# Patient Record
Sex: Male | Born: 2000 | Race: White | Hispanic: No | Marital: Single | Smoking: Never smoker
Health system: Southern US, Community
[De-identification: ages and names within clinical notes are randomized; demographics above are authoritative.]

---
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
## 2020-09-05 ENCOUNTER — Encounter: Payer: Self-pay | Admitting: Family Medicine

## 2020-11-25 ENCOUNTER — Ambulatory Visit (INDEPENDENT_AMBULATORY_CARE_PROVIDER_SITE_OTHER): Payer: Managed Care, Other (non HMO) | Admitting: Family Medicine

## 2020-11-25 ENCOUNTER — Other Ambulatory Visit: Payer: Self-pay

## 2020-11-25 ENCOUNTER — Encounter: Payer: Self-pay | Admitting: Family Medicine

## 2020-11-25 VITALS — BP 110/66 | HR 94 | Temp 97.9°F | Ht 71.0 in | Wt 181.4 lb

## 2020-11-25 DIAGNOSIS — Z0000 Encounter for general adult medical examination without abnormal findings: Secondary | ICD-10-CM

## 2020-11-25 NOTE — Patient Instructions (Signed)
It was very nice to see you today!  Keep up the good work!  No changes today.  I will see you back in a year for your next annual checkup.  Please come back to see me sooner if needed.  Take care, Dr Jimmey Ralph  Please try these tips to maintain a healthy lifestyle:   Eat at least 3 REAL meals and 1-2 snacks per day.  Aim for no more than 5 hours between eating.  If you eat breakfast, please do so within one hour of getting up.    Each meal should contain half fruits/vegetables, one quarter protein, and one quarter carbs (no bigger than a computer mouse)   Cut down on sweet beverages. This includes juice, soda, and sweet tea.     Drink at least 1 glass of water with each meal and aim for at least 8 glasses per day   Exercise at least 150 minutes every week.    Preventive Care 38-58 Years Old, Male Preventive care refers to lifestyle choices and visits with your health care provider that can promote health and wellness. This includes:  A yearly physical exam. This is also called an annual well check.  Regular dental and eye exams.  Immunizations.  Screening for certain conditions.  Healthy lifestyle choices, such as eating a healthy diet, getting regular exercise, not using drugs or products that contain nicotine and tobacco, and limiting alcohol use. What can I expect for my preventive care visit? Physical exam Your health care provider will check:  Height and weight. These may be used to calculate body mass index (BMI), which is a measurement that tells if you are at a healthy weight.  Heart rate and blood pressure.  Your skin for abnormal spots. Counseling Your health care provider may ask you questions about:  Alcohol, tobacco, and drug use.  Emotional well-being.  Home and relationship well-being.  Sexual activity.  Eating habits.  Work and work Astronomer. What immunizations do I need?  Influenza (flu) vaccine  This is recommended every  year. Tetanus, diphtheria, and pertussis (Tdap) vaccine  You may need a Td booster every 10 years. Varicella (chickenpox) vaccine  You may need this vaccine if you have not already been vaccinated. Human papillomavirus (HPV) vaccine  If recommended by your health care provider, you may need three doses over 6 months. Measles, mumps, and rubella (MMR) vaccine  You may need at least one dose of MMR. You may also need a second dose. Meningococcal conjugate (MenACWY) vaccine  One dose is recommended if you are 52-64 years old and a Orthoptist living in a residence hall, or if you have one of several medical conditions. You may also need additional booster doses. Pneumococcal conjugate (PCV13) vaccine  You may need this if you have certain conditions and were not previously vaccinated. Pneumococcal polysaccharide (PPSV23) vaccine  You may need one or two doses if you smoke cigarettes or if you have certain conditions. Hepatitis A vaccine  You may need this if you have certain conditions or if you travel or work in places where you may be exposed to hepatitis A. Hepatitis B vaccine  You may need this if you have certain conditions or if you travel or work in places where you may be exposed to hepatitis B. Haemophilus influenzae type b (Hib) vaccine  You may need this if you have certain risk factors. You may receive vaccines as individual doses or as more than one vaccine together in one  shot (combination vaccines). Talk with your health care provider about the risks and benefits of combination vaccines. What tests do I need? Blood tests  Lipid and cholesterol levels. These may be checked every 5 years starting at age 30.  Hepatitis C test.  Hepatitis B test. Screening   Diabetes screening. This is done by checking your blood sugar (glucose) after you have not eaten for a while (fasting).  Sexually transmitted disease (STD) testing. Talk with your health care  provider about your test results, treatment options, and if necessary, the need for more tests. Follow these instructions at home: Eating and drinking   Eat a diet that includes fresh fruits and vegetables, whole grains, lean protein, and low-fat dairy products.  Take vitamin and mineral supplements as recommended by your health care provider.  Do not drink alcohol if your health care provider tells you not to drink.  If you drink alcohol: ? Limit how much you have to 0-2 drinks a day. ? Be aware of how much alcohol is in your drink. In the [place] one drink equals one 12 oz bottle of beer (355 mL), one 5 oz glass of wine (148 mL), or one 1 oz glass of hard liquor (44 mL). Lifestyle  Take daily care of your teeth and gums.  Stay active. Exercise for at least 30 minutes on 5 or more days each week.  Do not use any products that contain nicotine or tobacco, such as cigarettes, e-cigarettes, and chewing tobacco. If you need help quitting, ask your health care provider.  If you are sexually active, practice safe sex. Use a condom or other form of protection to prevent STIs (sexually transmitted infections). What's next?  Go to your health care provider once a year for a well check visit.  Ask your health care provider how often you should have your eyes and teeth checked.  Stay up to date on all vaccines. This information is not intended to replace advice given to you by your health care provider. Make sure you discuss any questions you have with your health care provider. Document Revised: 11/17/2018 Document Reviewed: 11/17/2018 Elsevier Patient Education  2020 [employer].

## 2020-11-25 NOTE — Progress Notes (Signed)
Chief Complaint:  Lawrence Nelson is a 19 y.o. male who presents today for his annual comprehensive physical exam.    Assessment/Plan:  Healthy 19 year old male doing well.   Preventative Healthcare: UTD on vaccines and screenings.   Patient Counseling(The following topics were reviewed and/or handout was given):  -Nutrition: Stressed importance of moderation in sodium/caffeine intake, saturated fat and cholesterol, caloric balance, sufficient intake of fresh fruits, vegetables, and fiber.  -Stressed the importance of regular exercise.   -Substance Abuse: Discussed cessation/primary prevention of tobacco, alcohol, or other drug use; driving or other dangerous activities under the influence; availability of treatment for abuse.   -Injury prevention: Discussed safety belts, safety helmets, smoke detector, smoking near bedding or upholstery.   -Sexuality: Discussed sexually transmitted diseases, partner selection, use of condoms, avoidance of unintended pregnancy and contraceptive alternatives.   -Dental health: Discussed importance of regular tooth brushing, flossing, and dental visits.  -Health maintenance and immunizations reviewed. Please refer to Health maintenance section.  Return to care in 1 year for next preventative visit.     Subjective:  HPI:  He has no acute complaints today.   Lifestyle Diet: Balanced. Plenty of fruits and vegetables.  Exercise: Goes to the gym regularly. Plays baseball for college.   Depression screen PHQ 2/9 11/25/2020  Decreased Interest 0  Down, Depressed, Hopeless 0  PHQ - 2 Score 0   Health Maintenance Due  Topic Date Due  . Hepatitis C Screening  Never done    ROS: Per HPI, otherwise a complete review of systems was negative.   PMH:  The following were reviewed and entered/updated in epic: History reviewed. No pertinent past medical history. There are no problems to display for this patient.  Past Surgical History:  Procedure  Laterality Date  . LAPAROSCOPIC APPENDECTOMY N/A 08/13/2017   Procedure: APPENDECTOMY LAPAROSCOPIC;  Surgeon: Leonia Corona, MD;  Location: [facility] OR;  Service: General;  Laterality: N/A;    Family History  Problem Relation Age of Onset  . Hyperlipidemia Mother   . Hyperlipidemia Father   . Hypertension Father   . Hyperlipidemia Maternal Grandmother   . Hyperlipidemia Paternal Grandfather   . Hypertension Paternal Grandfather     Medications- reviewed and updated No current outpatient medications on file.   No current facility-administered medications for this visit.    Allergies-reviewed and updated No Known Allergies  Social History   Socioeconomic History  . Marital status: Single    Spouse name: Not on file  . Number of children: Not on file  . Years of education: Not on file  . Highest education level: Not on file  Occupational History  . Not on file  Tobacco Use  . Smoking status: Never Smoker  . Smokeless tobacco: Never Used  Vaping Use  . Vaping Use: Never used  Substance and Sexual Activity  . Alcohol use: No  . Drug use: No  . Sexual activity: Never  Other Topics Concern  . Not on file  Social History Narrative   Lives with parents, sister, 2 dogs and 2 cats.   Social Determinants of Health   Financial Resource Strain: Not on file  Food Insecurity: Not on file  Transportation Needs: Not on file  Physical Activity: Not on file  Stress: Not on file  Social Connections: Not on file        Objective:  Physical Exam: BP 110/66   Pulse 94   Temp 97.9 F (36.6 C) (Temporal)   Ht 5'  11" (1.803 m)   Wt 181 lb 6.4 oz (82.3 kg)   SpO2 96%   BMI 25.30 kg/m   Body mass index is 25.3 kg/m. Wt Readings from Last 3 Encounters:  11/25/20 181 lb 6.4 oz (82.3 kg) (84 %, Z= 0.99)*  08/14/17 158 lb 4.6 oz (71.8 kg) (83 %, Z= 0.96)*   * Growth percentiles are based on CDC (Boys, 2-20 Years) data.   Gen: NAD, resting comfortably HEENT: TMs normal  bilaterally. OP clear. No thyromegaly noted.  CV: RRR with no murmurs appreciated Pulm: NWOB, CTAB with no crackles, wheezes, or rhonchi GI: Normal bowel sounds present. Soft, Nontender, Nondistended. MSK: no edema, cyanosis, or clubbing noted Skin: warm, dry Neuro: CN2-12 grossly intact. Strength 5/5 in upper and lower extremities. Reflexes symmetric and intact bilaterally.  Psych: Normal affect and thought content     Lawrence Nelson M. Jimmey Ralph, MD 11/25/2020 9:39 AM

## 2021-07-14 ENCOUNTER — Telehealth: Payer: Self-pay

## 2021-07-14 NOTE — Telephone Encounter (Signed)
Patient dropped off physical forms, aware of 5-7 business day to fill out and would like a call when ready.

## 2021-07-15 NOTE — Telephone Encounter (Signed)
Form place in Dr Jimmey Ralph Office

## 2021-07-16 ENCOUNTER — Telehealth: Payer: Self-pay | Admitting: *Deleted

## 2021-07-16 NOTE — Telephone Encounter (Signed)
Form ready to be pick up  Patient notified  Form placed at front office

## 2021-11-26 ENCOUNTER — Other Ambulatory Visit: Payer: Self-pay

## 2021-11-26 ENCOUNTER — Ambulatory Visit (INDEPENDENT_AMBULATORY_CARE_PROVIDER_SITE_OTHER): Payer: Managed Care, Other (non HMO) | Admitting: Family Medicine

## 2021-11-26 VITALS — BP 120/64 | HR 85 | Temp 98.0°F | Ht 70.87 in | Wt 190.8 lb

## 2021-11-26 DIAGNOSIS — Z0000 Encounter for general adult medical examination without abnormal findings: Secondary | ICD-10-CM | POA: Diagnosis not present

## 2021-11-26 NOTE — Patient Instructions (Signed)
It was very nice to see you today!  Keep up the good work!  We will see you back in a year. Come back sooner if needed.   Take care, Dr Jimmey Ralph  PLEASE NOTE:  If you had any lab tests please let us know if you have not heard back within a few days. You may see your results on mychart before we have a chance to review them but we will give you a call once they are reviewed by [place]. If we ordered any referrals today, please let us know if you have not heard from their office within the next week.   Please try these tips to maintain a healthy lifestyle:  Eat at least 3 REAL meals and 1-2 snacks per day.  Aim for no more than 5 hours between eating.  If you eat breakfast, please do so within one hour of getting up.   Each meal should contain half fruits/vegetables, one quarter protein, and one quarter carbs (no bigger than a computer mouse)  Cut down on sweet beverages. This includes juice, soda, and sweet tea.   Drink at least 1 glass of water with each meal and aim for at least 8 glasses per day  Exercise at least 150 minutes every week.    Preventive Care 28-14 Years Old, Male Preventive care refers to lifestyle choices and visits with your health care provider that can promote health and wellness. At this stage in your life, you may start seeing a primary care physician instead of a pediatrician for your preventive care. Preventive care visits are also called wellness exams. What can I expect for my preventive care visit? Counseling During your preventive care visit, your health care provider may ask about your: Medical history, including: Past medical problems. Family medical history. Current health, including: Home life and relationship well-being. Emotional well-being. Sexual activity and sexual health. Lifestyle, including: Alcohol, nicotine or tobacco, and drug use. Access to firearms. Diet, exercise, and sleep habits. Sunscreen use. Motor vehicle safety. Physical  exam Your health care provider may check your: Height and weight. These may be used to calculate your BMI (body mass index). BMI is a measurement that tells if you are at a healthy weight. Waist circumference. This measures the distance around your waistline. This measurement also tells if you are at a healthy weight and may help predict your risk of certain diseases, such as type 2 diabetes and high blood pressure. Heart rate and blood pressure. Body temperature. Skin for abnormal spots. What immunizations do I need? Vaccines are usually given at various ages, according to a schedule. Your health care provider will recommend vaccines for you based on your age, medical history, and lifestyle or other factors, such as travel or where you work. What tests do I need? Screening Your health care provider may recommend screening tests for certain conditions. This may include: Vision and hearing tests. Lipid and cholesterol levels. Hepatitis B test. Hepatitis C test. HIV (human immunodeficiency virus) test. STI (sexually transmitted infection) testing, if you are at risk. Tuberculosis skin test. Talk with your health care provider about your test results, treatment options, and if necessary, the need for more tests. Follow these instructions at home: Eating and drinking  Eat a healthy diet that includes fresh fruits and vegetables, whole grains, lean protein, and low-fat dairy products. Drink enough fluid to keep your urine pale yellow. Do not drink alcohol if: Your health care provider tells you not to drink. You are under the  legal drinking age. In the [place] the legal drinking age is 52. If you drink alcohol: Limit how much you have to 0-2 drinks a day. Know how much alcohol is in your drink. In the [place] one drink equals one 12 oz bottle of beer (355 mL), one 5 oz glass of wine (148 mL), or one 1 oz glass of hard liquor (44 mL). Lifestyle Brush your teeth every morning and night with  fluoride toothpaste. Floss one time each day. Exercise for at least 30 minutes 5 or more days of the week. Do not use any products that contain nicotine or tobacco. These products include cigarettes, chewing tobacco, and vaping devices, such as e-cigarettes. If you need help quitting, ask your health care provider. Do not use drugs. If you are sexually active, practice safe sex. Use a condom or other form of protection to prevent STIs. Find healthy ways to manage stress, such as: Meditation, yoga, or listening to music. Journaling. Talking to a trusted person. Spending time with friends and family. Safety Always wear your seat belt while driving or riding in a vehicle. Do not drive: If you have been drinking alcohol. Do not ride with someone who has been drinking. When you are tired or distracted. While texting. If you have been using any mind-altering substances or drugs. Wear a helmet and other protective equipment during sports activities. If you have firearms in your house, make sure you follow all gun safety procedures. Seek help if you have been bullied, physically abused, or sexually abused. Use the internet responsibly to avoid dangers, such as online bullying and online sex predators. What's next? Go to your health care provider once a year for an annual wellness visit. Ask your health care provider how often you should have your eyes and teeth checked. Stay up to date on all vaccines. This information is not intended to replace advice given to you by your health care provider. Make sure you discuss any questions you have with your health care provider. Document Revised: 05/21/2021 Document Reviewed: 05/21/2021 Elsevier Patient Education  2022 [employer].

## 2021-11-26 NOTE — Progress Notes (Signed)
Chief Complaint:  Lawrence Nelson is a 20 y.o. male who presents today for his annual comprehensive physical exam.    Assessment/Plan:  Healthy 20 year old male doing well.   Preventative Healthcare: UTD on vaccines and screenings.   Patient Counseling(The following topics were reviewed and/or handout was given):  -Nutrition: Stressed importance of moderation in sodium/caffeine intake, saturated fat and cholesterol, caloric balance, sufficient intake of fresh fruits, vegetables, and fiber.  -Stressed the importance of regular exercise.   -Substance Abuse: Discussed cessation/primary prevention of tobacco, alcohol, or other drug use; driving or other dangerous activities under the influence; availability of treatment for abuse.   -Injury prevention: Discussed safety belts, safety helmets, smoke detector, smoking near bedding or upholstery.   -Sexuality: Discussed sexually transmitted diseases, partner selection, use of condoms, avoidance of unintended pregnancy and contraceptive alternatives.   -Dental health: Discussed importance of regular tooth brushing, flossing, and dental visits.  -Health maintenance and immunizations reviewed. Please refer to Health maintenance section.  Return to care in 1 year for next preventative visit.     Subjective:  HPI:  He has no acute complaints today.   Lifestyle Diet: Balanced.  Exercise: Going to gym regularly. Plays baseball for college.   Depression screen PHQ 2/9 11/26/2021  Decreased Interest 0  Down, Depressed, Hopeless 0  PHQ - 2 Score 0    Health Maintenance Due  Topic Date Due   Hepatitis C Screening  Never done     ROS: Per HPI, otherwise a complete review of systems was negative.   PMH:  The following were reviewed and entered/updated in epic: No past medical history on file. There are no problems to display for this patient.  Past Surgical History:  Procedure Laterality Date   LAPAROSCOPIC APPENDECTOMY N/A 08/13/2017    Procedure: APPENDECTOMY LAPAROSCOPIC;  Surgeon: Leonia Corona, MD;  Location: [facility];  Service: General;  Laterality: N/A;    Family History  Problem Relation Age of Onset   Hyperlipidemia Mother    Hyperlipidemia Father    Hypertension Father    Hyperlipidemia Maternal Grandmother    Hyperlipidemia Paternal Grandfather    Hypertension Paternal Grandfather     Medications- reviewed and updated No current outpatient medications on file.   No current facility-administered medications for this visit.    Allergies-reviewed and updated No Known Allergies  Social History   Socioeconomic History   Marital status: Single    Spouse name: Not on file   Number of children: Not on file   Years of education: Not on file   Highest education level: Not on file  Occupational History   Not on file  Tobacco Use   Smoking status: Never   Smokeless tobacco: Never  Vaping Use   Vaping Use: Never used  Substance and Sexual Activity   Alcohol use: No   Drug use: No   Sexual activity: Never  Other Topics Concern   Not on file  Social History Narrative   Lives with parents, sister, 2 dogs and 2 cats.   Social Determinants of Health   Financial Resource Strain: Not on file  Food Insecurity: Not on file  Transportation Needs: Not on file  Physical Activity: Not on file  Stress: Not on file  Social Connections: Not on file        Objective:  Physical Exam: BP 120/64 (BP Location: Left Arm)    Pulse 85    Temp 98 F (36.7 C) (Temporal)    Ht 5'  10.87" (1.8 m)    Wt 190 lb 12.8 oz (86.5 kg)    SpO2 97%    BMI 26.71 kg/m   Body mass index is 26.71 kg/m. Wt Readings from Last 3 Encounters:  11/26/21 190 lb 12.8 oz (86.5 kg)  11/25/20 181 lb 6.4 oz (82.3 kg) (84 %, Z= 0.99)*  08/14/17 158 lb 4.6 oz (71.8 kg) (83 %, Z= 0.96)*   * Growth percentiles are based on CDC (Boys, 2-20 Years) data.   Gen: NAD, resting comfortably HEENT: TMs normal bilaterally. OP clear. No  thyromegaly noted.  CV: RRR with no murmurs appreciated Pulm: NWOB, CTAB with no crackles, wheezes, or rhonchi GI: Normal bowel sounds present. Soft, Nontender, Nondistended. MSK: no edema, cyanosis, or clubbing noted Skin: warm, dry Neuro: CN2-12 grossly intact. Strength 5/5 in upper and lower extremities. Reflexes symmetric and intact bilaterally.  Psych: Normal affect and thought content     Lawrence Grantham M. Jimmey Ralph, MD 11/26/2021 9:58 AM

## 2022-04-06 ENCOUNTER — Encounter: Payer: Self-pay | Admitting: Family Medicine

## 2022-04-06 ENCOUNTER — Ambulatory Visit (INDEPENDENT_AMBULATORY_CARE_PROVIDER_SITE_OTHER): Payer: Managed Care, Other (non HMO) | Admitting: Family Medicine

## 2022-04-06 VITALS — BP 122/76 | HR 83 | Temp 98.5°F | Ht 70.87 in | Wt 191.4 lb

## 2022-04-06 DIAGNOSIS — Z111 Encounter for screening for respiratory tuberculosis: Secondary | ICD-10-CM

## 2022-04-06 NOTE — Progress Notes (Signed)
? ?  Lawrence Nelson is a 21 y.o. male who presents today for an office visit. ? ?Assessment/Plan:  ?New/Acute Problems: ?Need for TB Screening ?We will check quantiferon today.  No cough or other symptoms.  No signs of any active infection. ? ?  ?Subjective:  ?HPI: ? 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  No history of TB.  No symptoms. ? ?   ?  ?Objective:  ?Physical Exam: ?BP 122/76 (BP Location: Right Arm)   Pulse 83   Temp 98.5 ?F (36.9 ?C) (Temporal)   Ht 5' 10.87" (1.8 m)   Wt 191 lb 6.4 oz (86.8 kg)   SpO2 98%   BMI 26.79 kg/m?   ?Gen: No acute distress, resting comfortably ?CV: Regular rate and rhythm with no murmurs appreciated ?Pulm: Normal work of breathing, clear to auscultation bilaterally with no crackles, wheezes, or rhonchi ?Neuro: Grossly normal, moves all extremities ?Psych: Normal affect and thought content ? ?   ? ?Katina Degree. Jimmey Ralph, MD ?04/06/2022 1:30 PM  ?

## 2022-04-06 NOTE — Patient Instructions (Signed)
It was very nice to see you today! ? ?We will get a TB test today and contact you with the results once they are back.  ? ?Take care, ?Dr Jimmey Ralph ? ?PLEASE NOTE: ? ?If you had any lab tests please let us know if you have not heard back within a few days. You may see your results on mychart before we have a chance to review them but we will give you a call once they are reviewed by [place]. If we ordered any referrals today, please let us know if you have not heard from their office within the next week.  ? ?Please try these tips to maintain a healthy lifestyle: ? ?Eat at least 3 REAL meals and 1-2 snacks per day.  Aim for no more than 5 hours between eating.  If you eat breakfast, please do so within one hour of getting up.  ? ?Each meal should contain half fruits/vegetables, one quarter protein, and one quarter carbs (no bigger than a computer mouse) ? ?Cut down on sweet beverages. This includes juice, soda, and sweet tea.  ? ?Drink at least 1 glass of water with each meal and aim for at least 8 glasses per day ? ?Exercise at least 150 minutes every week.   ?

## 2022-04-08 LAB — QUANTIFERON-TB GOLD PLUS
Mitogen-NIL: >10 IU/mL
NIL: 0.04 IU/mL
QuantiFERON-TB Gold Plus: NEGATIVE
TB1-NIL: <0 IU/mL
TB2-NIL: <0 IU/mL

## 2022-04-09 NOTE — Progress Notes (Signed)
Please inform patient of the following: ? ?TB test is negative. ? ?Katina Degree. Jimmey Ralph, MD ?04/09/2022 12:23 PM  ?

## 2022-07-08 ENCOUNTER — Telehealth: Payer: Self-pay | Admitting: Family Medicine

## 2022-07-08 NOTE — Telephone Encounter (Signed)
Left message to return call to our office at their convenience.  Ok to dropped form to be done

## 2022-07-08 NOTE — Telephone Encounter (Signed)
Patient states:  -He needs an athletic physical form filled out for school by 08/15.   Patient requests: - To drop off form so this can be completed and picked up later on by the patient.   Patient has had a physical completed with PCP on 11/26/21.

## 2022-07-09 ENCOUNTER — Telehealth: Payer: Self-pay | Admitting: Family Medicine

## 2022-07-09 NOTE — Telephone Encounter (Signed)
..  Type of form received:CPE form for sports  Additional comments:   Received by: patient drop off   Form should be Faxed to: NA   Form should be mailed to:   NA    Is patient requesting call for pickup: YES    Form placed:   Dr Rhys Martini folder   Attach charge sheet.  yes  Individual made aware of 3-5 business day turn around (Y/N)?

## 2022-07-10 ENCOUNTER — Encounter: Payer: Self-pay | Admitting: Family Medicine

## 2022-07-10 NOTE — Telephone Encounter (Signed)
Left message to return call to our office at their convenience.   Patient need to fill form prior to PCP finish his part

## 2022-07-10 NOTE — Telephone Encounter (Signed)
Form placed in PCP office to be sign 

## 2022-07-10 NOTE — Telephone Encounter (Signed)
Pt states: -will complete a new form -will have parent drop off to [facility] next week (week of 08/07)

## 2022-07-13 NOTE — Telephone Encounter (Signed)
Form Printed placed on PCP office for review

## 2022-07-15 NOTE — Telephone Encounter (Signed)
Form ready to be pick up  Patient notified  Copy placed to be scan on patient chart

## 2022-07-16 NOTE — Telephone Encounter (Signed)
See previews note 

## 2022-08-31 ENCOUNTER — Encounter: Payer: Self-pay | Admitting: *Deleted

## 2022-11-19 ENCOUNTER — Encounter: Payer: Self-pay | Admitting: *Deleted

## 2022-12-03 ENCOUNTER — Encounter: Payer: Managed Care, Other (non HMO) | Admitting: Family Medicine

## 2022-12-10 ENCOUNTER — Encounter: Payer: Managed Care, Other (non HMO) | Admitting: Family Medicine

## 2022-12-11 ENCOUNTER — Ambulatory Visit (INDEPENDENT_AMBULATORY_CARE_PROVIDER_SITE_OTHER): Payer: Managed Care, Other (non HMO) | Admitting: Family Medicine

## 2022-12-11 ENCOUNTER — Encounter: Payer: Self-pay | Admitting: Family Medicine

## 2022-12-11 VITALS — BP 123/72 | HR 75 | Temp 98.0°F | Ht 70.0 in | Wt 197.2 lb

## 2022-12-11 DIAGNOSIS — Z0001 Encounter for general adult medical examination with abnormal findings: Secondary | ICD-10-CM | POA: Diagnosis not present

## 2022-12-11 DIAGNOSIS — H6992 Unspecified Eustachian tube disorder, left ear: Secondary | ICD-10-CM | POA: Diagnosis not present

## 2022-12-11 DIAGNOSIS — Z23 Encounter for immunization: Secondary | ICD-10-CM

## 2022-12-11 NOTE — Progress Notes (Signed)
Chief Complaint:  Lawrence Nelson is a 22 y.o. male who presents today for his annual comprehensive physical exam.    Assessment/Plan:  Eustachian tube dysfunction No red flaags.  No signs of otitis media. He can try using over-the-counter allergy meds.  Also recommended gentle self insufflation.  He will let us know if not improving.  Preventative Healthcare: Flu shot given today.   Patient Counseling(The following topics were reviewed and/or handout was given):  -Nutrition: Stressed importance of moderation in sodium/caffeine intake, saturated fat and cholesterol, caloric balance, sufficient intake of fresh fruits, vegetables, and fiber.  -Stressed the importance of regular exercise.   -Substance Abuse: Discussed cessation/primary prevention of tobacco, alcohol, or other drug use; driving or other dangerous activities under the influence; availability of treatment for abuse.   -Injury prevention: Discussed safety belts, safety helmets, smoke detector, smoking near bedding or upholstery.   -Sexuality: Discussed sexually transmitted diseases, partner selection, use of condoms, avoidance of unintended pregnancy and contraceptive alternatives.   -Dental health: Discussed importance of regular tooth brushing, flossing, and dental visits.  -Health maintenance and immunizations reviewed. Please refer to Health maintenance section.  Return to care in 1 year for next preventative visit.     Subjective:  HPI:  He has no acute complaints today.   Lifestyle Diet: Balanced. Plenty of fruits and vegetables.  Exercise: 6 days per week. Weight training.      12/11/2022    7:44 AM  Depression screen PHQ 2/9  Decreased Interest 0  Down, Depressed, Hopeless 0  PHQ - 2 Score 0   ROS: Per HPI, otherwise a complete review of systems was negative.   PMH:  The following were reviewed and entered/updated in epic: History reviewed. No pertinent past medical history. There are no problems to  display for this patient.  Past Surgical History:  Procedure Laterality Date   LAPAROSCOPIC APPENDECTOMY N/A 08/13/2017   Procedure: APPENDECTOMY LAPAROSCOPIC;  Surgeon: Gerald Stabs, MD;  Location: [facility];  Service: General;  Laterality: N/A;    Family History  Problem Relation Age of Onset   Hyperlipidemia Mother    Hyperlipidemia Father    Hypertension Father    Hyperlipidemia Maternal Grandmother    Hyperlipidemia Paternal Grandfather    Hypertension Paternal Grandfather     Medications- reviewed and updated No current outpatient medications on file.   No current facility-administered medications for this visit.    Allergies-reviewed and updated No Known Allergies  Social History   Socioeconomic History   Marital status: Single    Spouse name: Not on file   Number of children: Not on file   Years of education: Not on file   Highest education level: Not on file  Occupational History   Not on file  Tobacco Use   Smoking status: Never   Smokeless tobacco: Never  Vaping Use   Vaping Use: Never used  Substance and Sexual Activity   Alcohol use: No   Drug use: No   Sexual activity: Never  Other Topics Concern   Not on file  Social History Narrative   Lives with parents, sister, 2 dogs and 2 cats.   Social Determinants of Health   Financial Resource Strain: Not on file  Food Insecurity: Not on file  Transportation Needs: Not on file  Physical Activity: Not on file  Stress: Not on file  Social Connections: Not on file        Objective:  Physical Exam: BP 123/72   Pulse 75  Temp 98 F (36.7 C) (Temporal)   Ht 5\' 10"  (1.778 m)   Wt 197 lb 3.2 oz (89.4 kg)   SpO2 100%   BMI 28.30 kg/m   Body mass index is 28.3 kg/m. Wt Readings from Last 3 Encounters:  12/11/22 197 lb 3.2 oz (89.4 kg)  04/06/22 191 lb 6.4 oz (86.8 kg)  11/26/21 190 lb 12.8 oz (86.5 kg)   Gen: NAD, resting comfortably HEENT: Left TM erythematous with clear effusion.  Right TM  clear. OP clear. No thyromegaly noted.  CV: RRR with no murmurs appreciated Pulm: NWOB, CTAB with no crackles, wheezes, or rhonchi GI: Normal bowel sounds present. Soft, Nontender, Nondistended. MSK: no edema, cyanosis, or clubbing noted Skin: warm, dry Neuro: CN2-12 grossly intact. Strength 5/5 in upper and lower extremities. Reflexes symmetric and intact bilaterally.  Psych: Normal affect and thought content     Haadi Santellan M. Jerline Pain, MD 12/11/2022 8:15 AM

## 2022-12-11 NOTE — Patient Instructions (Signed)
It was very nice to see you today!  We gave you your flu shot today.  Please keep up the good work on your diet and exercise.  We will see you back in a year for your next physical.  Please come back to see Lawrence Nelson sooner if needed.  Take care, Dr Jerline Pain  PLEASE NOTE:  If you had any lab tests, please let Lawrence Nelson know if you have not heard back within a few days. You may see your results on mychart before we have a chance to review them but we will give you a call once they are reviewed by Lawrence Nelson.   If we ordered any referrals today, please let Lawrence Nelson know if you have not heard from their office within the next week.   If you had any urgent prescriptions sent in today, please check with the pharmacy within an hour of our visit to make sure the prescription was transmitted appropriately.   Please try these tips to maintain a healthy lifestyle:  Eat at least 3 REAL meals and 1-2 snacks per day.  Aim for no more than 5 hours between eating.  If you eat breakfast, please do so within one hour of getting up.   Each meal should contain half fruits/vegetables, one quarter protein, and one quarter carbs (no bigger than a computer mouse)  Cut down on sweet beverages. This includes juice, soda, and sweet tea.   Drink at least 1 glass of water with each meal and aim for at least 8 glasses per day  Exercise at least 150 minutes every week.    Preventive Care 26-82 Years Old, Male Preventive care refers to lifestyle choices and visits with your health care provider that can promote health and wellness. Preventive care visits are also called wellness exams. What can I expect for my preventive care visit? Counseling During your preventive care visit, your health care provider may ask about your: Medical history, including: Past medical problems. Family medical history. Current health, including: Emotional well-being. Home life and relationship well-being. Sexual activity. Lifestyle, including: Alcohol,  nicotine or tobacco, and drug use. Access to firearms. Diet, exercise, and sleep habits. Safety issues such as seatbelt and bike helmet use. Sunscreen use. Work and work Statistician. Physical exam Your health care provider may check your: Height and weight. These may be used to calculate your BMI (body mass index). BMI is a measurement that tells if you are at a healthy weight. Waist circumference. This measures the distance around your waistline. This measurement also tells if you are at a healthy weight and may help predict your risk of certain diseases, such as type 2 diabetes and high blood pressure. Heart rate and blood pressure. Body temperature. Skin for abnormal spots. What immunizations do I need?  Vaccines are usually given at various ages, according to a schedule. Your health care provider will recommend vaccines for you based on your age, medical history, and lifestyle or other factors, such as travel or where you work. What tests do I need? Screening Your health care provider may recommend screening tests for certain conditions. This may include: Lipid and cholesterol levels. Diabetes screening. This is done by checking your blood sugar (glucose) after you have not eaten for a while (fasting). Hepatitis B test. Hepatitis C test. HIV (human immunodeficiency virus) test. STI (sexually transmitted infection) testing, if you are at risk. Talk with your health care provider about your test results, treatment options, and if necessary, the need for more tests. Follow  these instructions at home: Eating and drinking  Eat a healthy diet that includes fresh fruits and vegetables, whole grains, lean protein, and low-fat dairy products. Drink enough fluid to keep your urine pale yellow. Take vitamin and mineral supplements as recommended by your health care provider. Do not drink alcohol if your health care provider tells you not to drink. If you drink alcohol: Limit how much you  have to 0-2 drinks a day. Know how much alcohol is in your drink. In the [place] one drink equals one 12 oz bottle of beer (355 mL), one 5 oz glass of wine (148 mL), or one 1 oz glass of hard liquor (44 mL). Lifestyle Brush your teeth every morning and night with fluoride toothpaste. Floss one time each day. Exercise for at least 30 minutes 5 or more days each week. Do not use any products that contain nicotine or tobacco. These products include cigarettes, chewing tobacco, and vaping devices, such as e-cigarettes. If you need help quitting, ask your health care provider. Do not use drugs. If you are sexually active, practice safe sex. Use a condom or other form of protection to prevent STIs. Find healthy ways to manage stress, such as: Meditation, yoga, or listening to music. Journaling. Talking to a trusted person. Spending time with friends and family. Minimize exposure to UV radiation to reduce your risk of skin cancer. Safety Always wear your seat belt while driving or riding in a vehicle. Do not drive: If you have been drinking alcohol. Do not ride with someone who has been drinking. If you have been using any mind-altering substances or drugs. While texting. When you are tired or distracted. Wear a helmet and other protective equipment during sports activities. If you have firearms in your house, make sure you follow all gun safety procedures. Seek help if you have been physically or sexually abused. What's next? Go to your health care provider once a year for an annual wellness visit. Ask your health care provider how often you should have your eyes and teeth checked. Stay up to date on all vaccines. This information is not intended to replace advice given to you by your health care provider. Make sure you discuss any questions you have with your health care provider. Document Revised: 05/21/2021 Document Reviewed: 05/21/2021 Elsevier Patient Education  [place].

## 2023-06-29 ENCOUNTER — Telehealth: Payer: Self-pay | Admitting: Family Medicine

## 2023-06-29 NOTE — Telephone Encounter (Signed)
Patient dropped off document sports form , to be filled out by provider. Patient requested to send it back via Call Patient to pick up within 5-days. Document is located in providers tray at front office.Please advise at Mobile [phone] (mobile)

## 2023-06-30 NOTE — Telephone Encounter (Signed)
Placed in PCP office to be reviewed  

## 2023-06-30 NOTE — Telephone Encounter (Signed)
LVM form ready to be pick up at our front office  Copy placed to be scan in patient chart

## 2023-12-13 ENCOUNTER — Encounter: Payer: Managed Care, Other (non HMO) | Admitting: Family Medicine

## 2023-12-15 ENCOUNTER — Ambulatory Visit (INDEPENDENT_AMBULATORY_CARE_PROVIDER_SITE_OTHER): Payer: Managed Care, Other (non HMO) | Admitting: Family Medicine

## 2023-12-15 ENCOUNTER — Encounter: Payer: Self-pay | Admitting: Family Medicine

## 2023-12-15 VITALS — BP 116/76 | HR 64 | Temp 97.7°F | Ht 70.0 in | Wt 192.8 lb

## 2023-12-15 DIAGNOSIS — Z0001 Encounter for general adult medical examination with abnormal findings: Secondary | ICD-10-CM

## 2023-12-15 DIAGNOSIS — Z0000 Encounter for general adult medical examination without abnormal findings: Secondary | ICD-10-CM

## 2023-12-15 DIAGNOSIS — Z23 Encounter for immunization: Secondary | ICD-10-CM

## 2023-12-15 DIAGNOSIS — F17200 Nicotine dependence, unspecified, uncomplicated: Secondary | ICD-10-CM | POA: Diagnosis not present

## 2023-12-15 MED ORDER — VARENICLINE TARTRATE 1 MG PO TABS: 1.0000 mg | ORAL_TABLET | Freq: Two times a day (BID) | ORAL | 5 refills | Status: DC

## 2023-12-15 MED ORDER — VARENICLINE TARTRATE 0.5 MG PO TABS: ORAL_TABLET | ORAL | 0 refills | Status: DC

## 2023-12-15 NOTE — Progress Notes (Signed)
 Chief Complaint:  Lawrence Nelson is a 23 y.o. male who presents today for his annual comprehensive physical exam.    Assessment/Plan:  Chronic Problems Addressed Today: Nicotine dependence with current use Patient was asked about his nicotine use today and was strongly advised to quit. Patient is currently contemplative.  He is using Zen nicotine pouches daily.  We reviewed treatment options to assist him quit smoking including NRT, Chantix , and Bupropion.  We will start Chantix .  We discussed potential side effects.  Follow up at next office visit.   Total time spent counseling approximately 5 minutes.   Preventative Healthcare: Flu and Tdap given today.  Patient Counseling(The following topics were reviewed and/or handout was given):  -Nutrition: Stressed importance of moderation in sodium/caffeine intake, saturated fat and cholesterol, caloric balance, sufficient intake of fresh fruits, vegetables, and fiber.  -Stressed the importance of regular exercise.   -Substance Abuse: Discussed cessation/primary prevention of tobacco, alcohol, or other drug use; driving or other dangerous activities under the influence; availability of treatment for abuse.   -Injury prevention: Discussed safety belts, safety helmets, smoke detector, smoking near bedding or upholstery.   -Sexuality: Discussed sexually transmitted diseases, partner selection, use of condoms, avoidance of unintended pregnancy and contraceptive alternatives.   -Dental health: Discussed importance of regular tooth brushing, flossing, and dental visits.  -Health maintenance and immunizations reviewed. Please refer to Health maintenance section.  Return to care in 1 year for next preventative visit.     Subjective:  HPI:  He has no acute complaints today.  Patient here today for annual physical.  Since our last visit he underwent Tommy John surgery.  He is going through the rehab process.  He will be going back to school next week.   Baseball season starts in a few weeks.  He is on track per his orthopedist though has not yet been cleared to return to throwing.  He does request assistance to help with nicotine cessation.  He has been using the nicotine patches for the last year or so.  Lifestyle Diet: Balanced. Plenty of fruits and vegetables.  Exercise: Play baseball for university. Weight training.      12/15/2023    7:29 AM  Depression screen PHQ 2/9  Decreased Interest 0  Down, Depressed, Hopeless 0  PHQ - 2 Score 0    Health Maintenance Due  Topic Date Due   Meningococcal B Vaccine  Never done   Hepatitis C Screening  Never done     ROS: Per HPI, otherwise a complete review of systems was negative.   PMH:  The following were reviewed and entered/updated in epic: History reviewed. No pertinent past medical history. Patient Active Problem List   Diagnosis Date Noted   Nicotine dependence with current use 12/15/2023   Past Surgical History:  Procedure Laterality Date   LAPAROSCOPIC APPENDECTOMY N/A 08/13/2017   Procedure: APPENDECTOMY LAPAROSCOPIC;  Surgeon: Claudius Kaplan, MD;  Location: [facility] OR;  Service: General;  Laterality: N/A;    Family History  Problem Relation Age of Onset   Hyperlipidemia Mother    Hyperlipidemia Father    Hypertension Father    Hyperlipidemia Maternal Grandmother    Hyperlipidemia Paternal Grandfather    Hypertension Paternal Grandfather     Medications- reviewed and updated Current Outpatient Medications  Medication Sig Dispense Refill   varenicline  (CHANTIX  CONTINUING MONTH PAK) 1 MG tablet Take 1 tablet (1 mg total) by mouth 2 (two) times daily. 60 tablet 5   varenicline  (CHANTIX ) 0.5  MG tablet Take 0.5 mg daily for 3 days, then 0.5 mg twice daily for 3 days, then 1 mg twice daily. 60 tablet 0   No current facility-administered medications for this visit.    Allergies-reviewed and updated No Known Allergies  Social History   Socioeconomic History    Marital status: Single    Spouse name: Not on file   Number of children: Not on file   Years of education: Not on file   Highest education level: Some college, no degree  Occupational History   Not on file  Tobacco Use   Smoking status: Never   Smokeless tobacco: Never  Vaping Use   Vaping status: Never Used  Substance and Sexual Activity   Alcohol use: No   Drug use: No   Sexual activity: Never  Other Topics Concern   Not on file  Social History Narrative   Lives with parents, sister, 2 dogs and 2 cats.   Social Drivers of Corporate Investment Banker Strain: Low Risk  (12/14/2023)   Overall Financial Resource Strain (CARDIA)    Difficulty of Paying Living Expenses: Not very hard  Food Insecurity: No Food Insecurity (12/14/2023)   Hunger Vital Sign    Worried About Running Out of Food in the Last Year: Never true    Ran Out of Food in the Last Year: Never true  Transportation Needs: No Transportation Needs (12/14/2023)   PRAPARE - Administrator, Civil Service (Medical): No    Lack of Transportation (Non-Medical): No  Physical Activity: Sufficiently Active (12/14/2023)   Exercise Vital Sign    Days of Exercise per Week: 5 days    Minutes of Exercise per Session: 90 min  Stress: Stress Concern Present (12/14/2023)   [employer] of Occupational Health - Occupational Stress Questionnaire    Feeling of Stress : To some extent  Social Connections: Moderately Integrated (12/14/2023)   Social Connection and Isolation Panel [NHANES]    Frequency of Communication with Friends and Family: More than three times a week    Frequency of Social Gatherings with Friends and Family: More than three times a week    Attends Religious Services: More than 4 times per year    Active Member of [employer] or Organizations: Yes    Attends Engineer, Structural: More than 4 times per year    Marital Status: Never married        Objective:  Physical Exam: BP 116/76   Pulse 64    Temp 97.7 F (36.5 C) (Temporal)   Ht 5' 10 (1.778 m)   Wt 192 lb 12.8 oz (87.5 kg)   SpO2 99%   BMI 27.66 kg/m   Body mass index is 27.66 kg/m. Wt Readings from Last 3 Encounters:  12/15/23 192 lb 12.8 oz (87.5 kg)  12/11/22 197 lb 3.2 oz (89.4 kg)  04/06/22 191 lb 6.4 oz (86.8 kg)   Gen: NAD, resting comfortably HEENT: TMs normal bilaterally. OP clear. No thyromegaly noted.  CV: RRR with no murmurs appreciated Pulm: NWOB, CTAB with no crackles, wheezes, or rhonchi GI: Normal bowel sounds present. Soft, Nontender, Nondistended. MSK: no edema, cyanosis, or clubbing noted Skin: warm, dry Neuro: CN2-12 grossly intact. Strength 5/5 in upper and lower extremities. Reflexes symmetric and intact bilaterally.  Psych: Normal affect and thought content     Lawrence Nelson M. Kennyth, MD 12/15/2023 7:54 AM

## 2023-12-15 NOTE — Patient Instructions (Signed)
 It was very nice to see you today!  Please start the Chantix .  Let me know how you are doing in a couple of weeks with this.  We will give you your flu and tetanus vaccine today.  Return in about 1 year (around 12/14/2024) for Annual Physical.   Take care, Dr Kennyth  PLEASE NOTE:  If you had any lab tests, please let us  know if you have not heard back within a few days. You may see your results on mychart before we have a chance to review them but we will give you a call once they are reviewed by us .   If we ordered any referrals today, please let us  know if you have not heard from their office within the next week.   If you had any urgent prescriptions sent in today, please check with the pharmacy within an hour of our visit to make sure the prescription was transmitted appropriately.   Please try these tips to maintain a healthy lifestyle:  Eat at least 3 REAL meals and 1-2 snacks per day.  Aim for no more than 5 hours between eating.  If you eat breakfast, please do so within one hour of getting up.   Each meal should contain half fruits/vegetables, one quarter protein, and one quarter carbs (no bigger than a computer mouse)  Cut down on sweet beverages. This includes juice, soda, and sweet tea.   Drink at least 1 glass of water with each meal and aim for at least 8 glasses per day  Exercise at least 150 minutes every week.    Preventive Care 21-16 Years Old, Male Preventive care refers to lifestyle choices and visits with your health care provider that can promote health and wellness. Preventive care visits are also called wellness exams. What can I expect for my preventive care visit? Counseling During your preventive care visit, your health care provider may ask about your: Medical history, including: Past medical problems. Family medical history. Current health, including: Emotional well-being. Home life and relationship well-being. Sexual activity. Lifestyle,  including: Alcohol, nicotine or tobacco, and drug use. Access to firearms. Diet, exercise, and sleep habits. Safety issues such as seatbelt and bike helmet use. Sunscreen use. Work and work astronomer. Physical exam Your health care provider may check your: Height and weight. These may be used to calculate your BMI (body mass index). BMI is a measurement that tells if you are at a healthy weight. Waist circumference. This measures the distance around your waistline. This measurement also tells if you are at a healthy weight and may help predict your risk of certain diseases, such as type 2 diabetes and high blood pressure. Heart rate and blood pressure. Body temperature. Skin for abnormal spots. What immunizations do I need?  Vaccines are usually given at various ages, according to a schedule. Your health care provider will recommend vaccines for you based on your age, medical history, and lifestyle or other factors, such as travel or where you work. What tests do I need? Screening Your health care provider may recommend screening tests for certain conditions. This may include: Lipid and cholesterol levels. Diabetes screening. This is done by checking your blood sugar (glucose) after you have not eaten for a while (fasting). Hepatitis B test. Hepatitis C test. HIV (human immunodeficiency virus) test. STI (sexually transmitted infection) testing, if you are at risk. Talk with your health care provider about your test results, treatment options, and if necessary, the need for more tests. Follow  these instructions at home: Eating and drinking  Eat a healthy diet that includes fresh fruits and vegetables, whole grains, lean protein, and low-fat dairy products. Drink enough fluid to keep your urine pale yellow. Take vitamin and mineral supplements as recommended by your health care provider. Do not drink alcohol if your health care provider tells you not to drink. If you drink  alcohol: Limit how much you have to 0-2 drinks a day. Know how much alcohol is in your drink. In the [place] one drink equals one 12 oz bottle of beer (355 mL), one 5 oz glass of wine (148 mL), or one 1 oz glass of hard liquor (44 mL). Lifestyle Brush your teeth every morning and night with fluoride toothpaste. Floss one time each day. Exercise for at least 30 minutes 5 or more days each week. Do not use any products that contain nicotine or tobacco. These products include cigarettes, chewing tobacco, and vaping devices, such as e-cigarettes. If you need help quitting, ask your health care provider. Do not use drugs. If you are sexually active, practice safe sex. Use a condom or other form of protection to prevent STIs. Find healthy ways to manage stress, such as: Meditation, yoga, or listening to music. Journaling. Talking to a trusted person. Spending time with friends and family. Minimize exposure to UV radiation to reduce your risk of skin cancer. Safety Always wear your seat belt while driving or riding in a vehicle. Do not drive: If you have been drinking alcohol. Do not ride with someone who has been drinking. If you have been using any mind-altering substances or drugs. While texting. When you are tired or distracted. Wear a helmet and other protective equipment during sports activities. If you have firearms in your house, make sure you follow all gun safety procedures. Seek help if you have been physically or sexually abused. What's next? Go to your health care provider once a year for an annual wellness visit. Ask your health care provider how often you should have your eyes and teeth checked. Stay up to date on all vaccines. This information is not intended to replace advice given to you by your health care provider. Make sure you discuss any questions you have with your health care provider. Document Revised: 05/21/2021 Document Reviewed: 05/21/2021 Elsevier Patient  Education  2024 [employer].

## 2023-12-15 NOTE — Assessment & Plan Note (Signed)
 Patient was asked about his nicotine use today and was strongly advised to quit. Patient is currently contemplative.  He is using Zen nicotine pouches daily.  We reviewed treatment options to assist him quit smoking including NRT, Chantix , and Bupropion.  We will start Chantix .  We discussed potential side effects.  Follow up at next office visit.   Total time spent counseling approximately 5 minutes.

## 2024-01-07 ENCOUNTER — Other Ambulatory Visit: Payer: Self-pay | Admitting: Family Medicine

## 2024-12-15 ENCOUNTER — Encounter: Payer: Self-pay | Admitting: Family Medicine

## 2024-12-15 ENCOUNTER — Ambulatory Visit: Payer: Managed Care, Other (non HMO) | Admitting: Family Medicine

## 2024-12-15 VITALS — BP 120/70 | HR 75 | Temp 97.2°F | Ht 70.0 in | Wt 191.4 lb

## 2024-12-15 DIAGNOSIS — F17200 Nicotine dependence, unspecified, uncomplicated: Secondary | ICD-10-CM | POA: Diagnosis not present

## 2024-12-15 DIAGNOSIS — Z0001 Encounter for general adult medical examination with abnormal findings: Secondary | ICD-10-CM

## 2024-12-15 DIAGNOSIS — Z0000 Encounter for general adult medical examination without abnormal findings: Secondary | ICD-10-CM | POA: Diagnosis not present

## 2024-12-15 DIAGNOSIS — Z23 Encounter for immunization: Secondary | ICD-10-CM

## 2024-12-15 NOTE — Patient Instructions (Addendum)
 It was very nice to see you today!  VISIT SUMMARY: Lawrence Nelson, a 24 year old male, came in for his annual physical exam. He is preparing for an Ironman competition and is focused on health and fitness. He continues to use nicotine products and finds it challenging to quit, especially in social settings.  YOUR PLAN: NICOTINE DEPENDENCE WITH CURRENT USE: You continue to use nicotine, particularly in social settings, and acknowledge its negative impact. You have expressed a desire to reduce use. -Try to reduce nicotine use, especially in social settings.  GENERAL HEALTH MAINTENANCE: You engage in regular exercise and maintain a balanced diet. Your blood pressure is normal, and there is no immediate need for blood work. -Continue your current exercise and dietary regimen. -Schedule blood work in the next year or two.  Return in about 1 year (around 12/15/2025) for Annual Physical.   Take care, Dr Kennyth  PLEASE NOTE:  If you had any lab tests, please let us  know if you have not heard back within a few days. You may see your results on mychart before we have a chance to review them but we will give you a call once they are reviewed by us .   If we ordered any referrals today, please let us  know if you have not heard from their office within the next week.   If you had any urgent prescriptions sent in today, please check with the pharmacy within an hour of our visit to make sure the prescription was transmitted appropriately.   Please try these tips to maintain a healthy lifestyle:  Eat at least 3 REAL meals and 1-2 snacks per day.  Aim for no more than 5 hours between eating.  If you eat breakfast, please do so within one hour of getting up.   Each meal should contain half fruits/vegetables, one quarter protein, and one quarter carbs (no bigger than a computer mouse)  Cut down on sweet beverages. This includes juice, soda, and sweet tea.   Drink at least 1 glass of water with each meal  and aim for at least 8 glasses per day  Exercise at least 150 minutes every week.

## 2024-12-15 NOTE — Assessment & Plan Note (Signed)
 Patient was asked about his tobacco use today and was strongly advised to quit. He is using Zen nicotine pouches routinely. Patient is currently contemplative.. We reviewed treatment options to assist him quit smoking including NRT, Chantix , and Bupropion. Follow up at next office visit.   Total time spent counseling approximately 3 minutes.

## 2024-12-15 NOTE — Progress Notes (Signed)
 "  Chief Complaint:  Lawrence Nelson is a 24 y.o. male who presents today for his annual comprehensive physical exam.    Assessment/Plan:  Chronic Problems Addressed Today: Nicotine dependence with current use Patient was asked about his tobacco use today and was strongly advised to quit. He is using Zen nicotine pouches routinely. Patient is currently contemplative.. We reviewed treatment options to assist him quit smoking including NRT, Chantix , and Bupropion. Follow up at next office visit.   Total time spent counseling approximately 3 minutes.   Preventative Healthcare: Meningitis B vaccine given today.  Blood work deferred for today-will check in 1 to 2 years.  Patient Counseling(The following topics were reviewed and/or handout was given):  -Nutrition: Stressed importance of moderation in sodium/caffeine intake, saturated fat and cholesterol, caloric balance, sufficient intake of fresh fruits, vegetables, and fiber.  -Stressed the importance of regular exercise.   -Substance Abuse: Discussed cessation/primary prevention of tobacco, alcohol, or other drug use; driving or other dangerous activities under the influence; availability of treatment for abuse.   -Injury prevention: Discussed safety belts, safety helmets, smoke detector, smoking near bedding or upholstery.   -Sexuality: Discussed sexually transmitted diseases, partner selection, use of condoms, avoidance of unintended pregnancy and contraceptive alternatives.   -Dental health: Discussed importance of regular tooth brushing, flossing, and dental visits.  -Health maintenance and immunizations reviewed. Please refer to Health maintenance section.  Return to care in 1 year for next preventative visit.     Subjective:  HPI:  He has no acute complaints today. Patient is here today for his annual physical.  See assessment / plan for status of chronic conditions.  Discussed the use of AI scribe software for clinical note  transcription with the patient, who gave verbal consent to proceed.  History of Present Illness Lawrence Nelson is a 24 year old male who presents for an annual physical exam.  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 He graduated in May and began this job in August.  He is preparing for an Ironman competition scheduled for October, focusing on health and fitness. His training regimen involves high endurance activities, and he has increased his carbohydrate intake significantly to support this.  He continues to use nicotine products. He attempted to cut down but found it challenging during baseball season due to peer influence. He acknowledges the difficulty in quitting, especially in social settings where others are using tobacco products.  He feels that everything is 'pretty solid right now.'        12/15/2024    8:01 AM  Depression screen PHQ 2/9  Decreased Interest 0  Down, Depressed, Hopeless 0  PHQ - 2 Score 0    Health Maintenance Due  Topic Date Due   Meningococcal B Vaccine (1 of 2 - Standard) Never done   Hepatitis C Screening  Never done     ROS: Per HPI, otherwise a complete review of systems was negative.   PMH:  The following were reviewed and entered/updated in epic: History reviewed. No pertinent past medical history. Patient Active Problem List   Diagnosis Date Noted   Nicotine dependence with current use 12/15/2023   Past Surgical History:  Procedure Laterality Date   LAPAROSCOPIC APPENDECTOMY N/A 08/13/2017   Procedure: APPENDECTOMY LAPAROSCOPIC;  Surgeon: Claudius Kaplan, MD;  Location: [facility] OR;  Service: General;  Laterality: N/A;    Family History  Problem Relation Age of Onset   Hyperlipidemia Mother    Hyperlipidemia Father    Hypertension Father    Hyperlipidemia Maternal Grandmother    Hyperlipidemia Paternal Grandfather     Hypertension Paternal Grandfather     Medications- reviewed and updated No current outpatient medications on file.   No current facility-administered medications for this visit.    Allergies-reviewed and updated Allergies[1]  Social History   Socioeconomic History   Marital status: Single    Spouse name: Not on file   Number of children: Not on file   Years of education: Not on file   Highest education level: Some college, no degree  Occupational History   Not on file  Tobacco Use   Smoking status: Never   Smokeless tobacco: Never  Vaping Use   Vaping status: Never Used  Substance and Sexual Activity   Alcohol use: No   Drug use: No   Sexual activity: Never  Other Topics Concern   Not on file  Social History Narrative   Lives with parents, sister, 2 dogs and 2 cats.   Social Drivers of Health   Tobacco Use: Low Risk (12/15/2024)   Patient History    Smoking Tobacco Use: Never    Smokeless Tobacco Use: Never    Passive Exposure: Not on file  Financial Resource Strain: Low Risk (12/14/2023)   Overall Financial Resource Strain (CARDIA)    Difficulty of Paying Living Expenses: Not very hard  Food Insecurity: No Food Insecurity (12/14/2023)   Hunger Vital Sign    Worried About Running Out of Food in the Last Year: Never true    Ran Out of Food in the Last Year: Never true  Transportation Needs: No Transportation Needs (12/14/2023)   PRAPARE - Administrator, Civil Service (Medical): No    Lack of Transportation (Non-Medical): No  Physical Activity: Sufficiently Active (12/14/2023)   Exercise Vital Sign    Days of Exercise per Week: 5 days    Minutes of Exercise per Session: 90 min  Stress: Stress Concern Present (12/14/2023)   [employer] of Occupational Health - Occupational Stress Questionnaire    Feeling of Stress : To some extent  Social Connections: Moderately Integrated (12/14/2023)   Social Connection and Isolation Panel    Frequency of  Communication with Friends and Family: More than three times a week    Frequency of Social Gatherings with Friends and Family: More than three times a week    Attends Religious Services: More than 4 times per year    Active Member of Clubs or Organizations: Yes    Attends Banker Meetings: More than 4 times per year    Marital Status: Never married  Depression (PHQ2-9): Low Risk (12/15/2024)   Depression (PHQ2-9)    PHQ-2 Score: 0  Alcohol Screen: Low Risk (12/14/2023)   Alcohol Screen    Last Alcohol Screening Score (AUDIT): 6  Housing: Low Risk (12/14/2023)   Housing Stability Vital Sign    Unable to Pay for Housing in the Last Year: No    Number of Times Moved in the Last Year: 0    Homeless in the Last Year: No  Utilities: Not on file  Health Literacy: Not on file        Objective:  Physical Exam: BP 120/70   Pulse 75   Temp (!) 97.2 F (36.2 C) (Temporal)   Ht 5' 10 (1.778 m)   Wt 191 lb 6.4 oz (86.8 kg)  SpO2 98%   BMI 27.46 kg/m   Body mass index is 27.46 kg/m. Wt Readings from Last 3 Encounters:  12/15/24 191 lb 6.4 oz (86.8 kg)  12/15/23 192 lb 12.8 oz (87.5 kg)  12/11/22 197 lb 3.2 oz (89.4 kg)   Gen: NAD, resting comfortably HEENT: TMs normal bilaterally. OP clear. No thyromegaly noted.  CV: RRR with no murmurs appreciated Pulm: NWOB, CTAB with no crackles, wheezes, or rhonchi GI: Normal bowel sounds present. Soft, Nontender, Nondistended. MSK: no edema, cyanosis, or clubbing noted Skin: warm, dry Neuro: CN2-12 grossly intact. Strength 5/5 in upper and lower extremities. Reflexes symmetric and intact bilaterally.  Psych: Normal affect and thought content     Jeremy Ditullio M. Kennyth, MD 12/15/2024 8:30 AM     [1] No Known Allergies  "

## 2025-06-14 ENCOUNTER — Ambulatory Visit

## 2025-12-20 ENCOUNTER — Encounter: Admitting: Family Medicine
